# Patient Record
Sex: Male | Born: 1995 | Race: Black or African American | Hispanic: No | Marital: Single | Smoking: Never smoker
Health system: Southern US, Community
[De-identification: ages and names within clinical notes are randomized; demographics above are authoritative.]

## PROBLEM LIST (undated history)

## (undated) DIAGNOSIS — G43909 Migraine, unspecified, not intractable, without status migrainosus: Secondary | ICD-10-CM

---
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
## 2017-02-12 ENCOUNTER — Encounter: Payer: Self-pay | Admitting: Physical Therapy

## 2017-02-12 ENCOUNTER — Ambulatory Visit: Payer: Medicaid Other | Attending: Orthopedic Surgery | Admitting: Physical Therapy

## 2017-02-12 DIAGNOSIS — R600 Localized edema: Secondary | ICD-10-CM | POA: Diagnosis present

## 2017-02-12 DIAGNOSIS — M6281 Muscle weakness (generalized): Secondary | ICD-10-CM

## 2017-02-12 DIAGNOSIS — Z4789 Encounter for other orthopedic aftercare: Secondary | ICD-10-CM

## 2017-02-12 DIAGNOSIS — M25662 Stiffness of left knee, not elsewhere classified: Secondary | ICD-10-CM

## 2017-02-12 NOTE — Therapy (Addendum)
[facility] [address] [place], [place], [address] Phone: [phone]   Fax:  [phone]  Physical Therapy Evaluation  Patient Details  Name: Matthew Chambers MRN: [id number] Date of Birth: [birth year] Referring Provider: Dr. Carmell Austria   Encounter Date: 02/12/2017  PT End of Session - 02/12/17 1413    Visit Number  1    Number of Visits  12    Date for PT Re-Evaluation  03/26/17    PT Start Time  1340    PT Stop Time  1422    PT Time Calculation (min)  42 min    Activity Tolerance  Patient tolerated treatment well    Behavior During Therapy  [facility] for tasks assessed/performed       History reviewed. No pertinent past medical history.  History reviewed. No pertinent surgical history.  There were no vitals filed for this visit.   Subjective Assessment - 02/12/17 1342    Subjective  Pt was playing intramural basketball, injured knee 11/19/16 and eventually had surgery 12/30/16.  He is in school at [employer], family in [place].  He is about 6 week out, had about 4 weeks of PT starting 1 week post surgery.  Has been doing HEP.  Pain is only with sitting for long periods when he stands, mostly stiffness.  Min swelling.  Walks better than prior.  He stopped wearing the brace when cleared.  He has some challenge with walking downhiill.   Used crutches post surgery.      Pertinent History  Had L knee arthroscopic ACL reconstruction and meniscectomy on 12/30/16    Limitations  Sitting;Standing;Walking;Other (comment) unable to play basketball, go to the gym, cleared for stationary bike    How long can you sit comfortably?  60 min stiff when standing     How long can you stand comfortably?  normal     How long can you walk comfortably?  normal     Diagnostic tests  XR     Patient Stated Goals  Pt would like to be able to run, play basketball again     Currently in Pain?  No/denies    Pain Score  2  with activity     Pain  Location  Knee    Pain Orientation  Left    Pain Descriptors / Indicators  Aching    Pain Type  Surgical pain    Pain Onset  More than a month ago    Pain Frequency  Intermittent    Aggravating Factors   transition to stand, bend too much, step down     Pain Relieving Factors  rest, walking a bit, gentle stretching     Effect of Pain on Daily Activities  limits gym and activity, take the elevator          [facility] PT Assessment - 02/13/17 0001      Assessment   Medical Diagnosis  L ACL reconstruction     Referring Provider  Dr. 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 25 inch     Circumferential - Left   17. 5 inch superior patella      Sensation   Light Touch  Impaired by gross assessment    Additional Comments  distal to patella L knee       Functional Tests   Functional tests  Squat;Single leg stance      Single Leg Stance   Comments  20 sec mild difficulty on L , Rt LE WNL       Posture/Postural Control   Posture Comments  not remarkable       AROM    Right Knee Extension  3    Right Knee Flexion  128    Left Knee Extension  5 8 deg quad lag     Left Knee Flexion  115      Strength   Right Hip Flexion  5/5    Left Hip Flexion  4/5    Right Knee Flexion  5/5    Right Knee Extension  5/5    Left Knee Flexion  4/5    Left Knee Extension  4/5      Flexibility   Hamstrings  min tightness      Palpation   Patella mobility  hypomobile     Palpation comment  non tender, min numbness      Ambulation/Gait   Ambulation Distance (Feet)  150 Feet    Gait Comments  no noticeable gait deviaton              Objective measurements completed on examination: See above findings.      [facility] Adult PT Treatment/Exercise - 02/13/17 0001      Self-Care   Self-Care  RICE;Heat/Ice Application;Other Self-Care Comments    RICE  ice post session    Other Self-Care Comments   Protocol, gait, steps       Knee/Hip Exercises: Aerobic   Stationary Bike  5 min no resistance       Cryotherapy   Number Minutes Cryotherapy  8 Minutes    Cryotherapy Location  Knee    Type of Cryotherapy  Ice pack             PT Education - 02/12/17 1411    Education provided  Yes    Education Details  PT, POC, protocol, OK for recumbant bike , ICE post    Person(s) Educated  Patient    Methods  Explanation;Handout    Comprehension  Verbalized understanding;Returned demonstration          PT Long Term Goals - 02/12/17 1617      PT LONG TERM GOAL #1   Title  Pt will be I with more advanced  HEP including modified gym exercises, light elliptical (not allowed until 12 weeks)     Baseline  has initial HEP from previous PT episode     Time  6    Period  Weeks    Status  New    Target Date  03/26/17      PT LONG TERM GOAL #2   Title  Pt will be able to stand on his L LE for 30 sec without difficulty with mod balance challenges.     Baseline  unable to do static >20 sec    Time  6    Period  Weeks    Status  New    Target Date  03/26/17       PT LONG TERM GOAL #3   Title  Pt will be able to negotiate 2-3 flights of stairs at school, reciprocally with no pain, no handrail.      Baseline  min discomfort heavy use of rail reciprocally    Time  6    Period  Weeks    Status  New    Target Date  03/26/17      PT LONG TERM GOAL #4   Title  Pt will have less than 5 deg quad lag x 10 reps     Baseline  8-10 deg lag     Time  6    Period  Weeks    Status  New    Target Date  03/26/17             Plan - 02/12/17 1631    Clinical Impression Statement  Pt presents for low complexity eval of L knee s/p ACL reconstruction.  He his at 6 weeks post op and is doing well.  He develops stiffness after sitting in class > 1 hour.  Knee AROM is lacking full extension and flexion (115 deg).  He had no pain with recumbant bike today and plans to ease into this at the gym with caution.      Clinical Presentation  Stable    Clinical Decision Making  Low    Rehab Potential  Excellent    PT Frequency  2x / week    PT Duration  6 weeks    PT Treatment/Interventions  ADLs/Self Care Home Management;Iontophoresis 4mg /ml Dexamethasone;Electrical Stimulation;Cryotherapy;Functional mobility training;Patient/family education;Therapeutic activities;Therapeutic exercise;Ultrasound;Balance training;Neuromuscular re-education;Manual techniques;Passive range of motion;Vasopneumatic Device    PT Next Visit Plan  to bring in PT HEP from other clinic.  Advance to CCK strengthening quads, hamstring.  check hip abd, add MMT.  Recumbant bike see protocol     PT Home Exercise Plan  has from other clinic     Consulted and Agree with Plan of Care  Patient       Patient will benefit from skilled therapeutic intervention in order to improve the following deficits and impairments:  Decreased range of motion, Increased fascial restricitons, Hypomobility, Impaired flexibility, Impaired sensation, Pain, Decreased balance, Decreased mobility, Decreased strength,  Increased edema  Visit Diagnosis: Aftercare for anterior cruciate ligament (ACL) repair - Plan: PT plan of care cert/re-cert  Localized edema - Plan: PT plan of care cert/re-cert  Muscle weakness (generalized) - Plan: PT plan of care cert/re-cert  Stiffness of left knee, not elsewhere classified - Plan: PT plan of care cert/re-cert     Problem List There are no active problems to display for this patient.   PAA,JENNIFER 02/13/2017, 8:15 AM  [facility] [address]  [place], [place], [address] Phone: [phone]   Fax:  [phone]  Name: Matthew Chambers MRN: [id number] Date of Birth: 06/24/1995   Karie Mainland, PT 02/13/17 8:15 AM Phone: [phone] Fax: [phone]

## 2017-02-26 ENCOUNTER — Ambulatory Visit: Payer: Medicaid Other | Attending: Orthopedic Surgery | Admitting: Physical Therapy

## 2017-02-26 DIAGNOSIS — M25662 Stiffness of left knee, not elsewhere classified: Secondary | ICD-10-CM | POA: Insufficient documentation

## 2017-02-26 DIAGNOSIS — R600 Localized edema: Secondary | ICD-10-CM | POA: Insufficient documentation

## 2017-02-26 DIAGNOSIS — Z4789 Encounter for other orthopedic aftercare: Secondary | ICD-10-CM | POA: Insufficient documentation

## 2017-02-26 DIAGNOSIS — M6281 Muscle weakness (generalized): Secondary | ICD-10-CM | POA: Insufficient documentation

## 2017-03-03 ENCOUNTER — Ambulatory Visit: Payer: Medicaid Other | Admitting: Physical Therapy

## 2017-03-05 ENCOUNTER — Encounter: Payer: Medicaid Other | Admitting: Physical Therapy

## 2017-03-10 ENCOUNTER — Ambulatory Visit: Payer: Medicaid Other | Admitting: Physical Therapy

## 2017-03-12 ENCOUNTER — Ambulatory Visit: Payer: Medicaid Other | Admitting: Physical Therapy

## 2017-03-17 ENCOUNTER — Ambulatory Visit: Payer: Medicaid Other | Admitting: Physical Therapy

## 2017-03-17 ENCOUNTER — Encounter: Payer: Self-pay | Admitting: Physical Therapy

## 2017-03-17 DIAGNOSIS — Z4789 Encounter for other orthopedic aftercare: Secondary | ICD-10-CM | POA: Diagnosis not present

## 2017-03-17 DIAGNOSIS — M25662 Stiffness of left knee, not elsewhere classified: Secondary | ICD-10-CM | POA: Diagnosis present

## 2017-03-17 DIAGNOSIS — M6281 Muscle weakness (generalized): Secondary | ICD-10-CM | POA: Diagnosis present

## 2017-03-17 DIAGNOSIS — R600 Localized edema: Secondary | ICD-10-CM

## 2017-03-17 NOTE — Therapy (Signed)
[facility] [address] [place], [place], [address] Phone: [phone]   Fax:  [phone]  Physical Therapy Treatment/POC renewal   Patient Details  Name: Doree FudgeMilton Halk MRN: [id number] Date of Birth: 1995-12-31 Referring Provider: Dr. Carmell Austria. Alex Creighton   Encounter Date: 03/17/2017  PT End of Session - 03/17/17 1346    Visit Number  2    Number of Visits  13    Date for PT Re-Evaluation  05/05/17    Authorization Time Period  2/20-4/16    Authorization - Visit Number  1    Authorization - Number of Visits  12    PT Start Time  1330    PT Stop Time  1423    PT Time Calculation (min)  53 min    Activity Tolerance  Patient tolerated treatment well    Behavior During Therapy  [facility] for tasks assessed/performed       History reviewed. No pertinent past medical history.  History reviewed. No pertinent surgical history.  There were no vitals filed for this visit.  Subjective Assessment - 03/17/17 1333    Subjective  No pain right now.  Sometimes has intermittent sharp pains can be when i'm sitting or laying down.  Has been doing his exercises.      Pertinent History  Had L knee arthroscopic ACL reconstruction and meniscectomy on 12/30/16    Currently in Pain?  No/denies         [facility] - 03/17/17 0001      AROM   Left Knee Flexion  125      Strength   Left Knee Flexion  5/5    Left Knee Extension  4/5          [facility] Adult PT Treatment/Exercise - 03/17/17 0001      Knee/Hip Exercises: Stretches   Active Hamstring Stretch  Left;3 reps;30 seconds    Knee: Self-Stretch to increase Flexion  Left;3 reps      Knee/Hip Exercises: Aerobic   Stationary Bike  5 min L 3       Knee/Hip Exercises: Standing   Forward Step Up  Left;1 set;20 reps;Hand Hold: 2;Step Height: 8"    Wall Squat  1 set;10 reps      Knee/Hip Exercises: Supine   Quad Sets  Strengthening;Left;1 set;10 reps    Straight Leg Raises   Strengthening;Left;1 set;10 reps    Straight Leg Raise with External Rotation  Strengthening;Left;1 set;10 reps      Cryotherapy   Number Minutes Cryotherapy  10 Minutes    Cryotherapy Location  Knee    Type of Cryotherapy  Ice pack                  PT Long Term Goals - 03/17/17 1419      PT LONG TERM GOAL #1   Title  Pt will be I with more advanced HEP including modified gym exercises, light elliptical (not allowed until 12 weeks)     Baseline  has initial HEP from previous PT episode , given today     Status  On-going      PT LONG TERM GOAL #2   Title  Pt will be able to stand on his L LE for 30 sec without difficulty with mod balance challenges.     Status  On-going      PT LONG TERM GOAL #3   Title  Pt will be able to negotiate 2-3 flights of stairs  at school, reciprocally with no pain, no handrail.      Status  On-going      PT LONG TERM GOAL #4   Title  Pt will have less than 5 deg quad lag x 10 reps     Status  On-going            Plan - 03/17/17 1346    Clinical Impression Statement  Pt doing well, cont to have weakness in LLE and decreased AROM, ant hip tightness. Decreased knee control with steps. Renewed POC to matrch MCD auth period . He is 10 weeks post, sees MD soon.  He was given HEP today based on his current presentation.     PT Frequency  2x / week    PT Duration  6 weeks 7 weeks     PT Treatment/Interventions  ADLs/Self Care Home Management;Iontophoresis 4mg /ml Dexamethasone;Electrical Stimulation;Cryotherapy;Functional mobility training;Patient/family education;Therapeutic activities;Therapeutic exercise;Ultrasound;Balance training;Neuromuscular re-education;Manual techniques;Passive range of motion;Vasopneumatic Device    PT Next Visit Plan  check HEP, progress strength, flexibility, advance to CCK , see protocol     PT Home Exercise Plan  ROM: quads, hams, hip flex, strength SLR , LAQ    Consulted and Agree with Plan of Care  Patient        Patient will benefit from skilled therapeutic intervention in order to improve the following deficits and impairments:  Decreased range of motion, Increased fascial restricitons, Hypomobility, Impaired flexibility, Impaired sensation, Pain, Decreased balance, Decreased mobility, Decreased strength, Increased edema  Visit Diagnosis: Aftercare for anterior cruciate ligament (ACL) repair  Localized edema  Muscle weakness (generalized)  Stiffness of left knee, not elsewhere classified     Problem List There are no active problems to display for this patient.   PAA,JENNIFER 03/17/2017, 2:21 PM  [facility] [address] [place], [place], [address] Phone: [phone]   Fax:  [phone]  Name: Degan Hanser MRN: [id number] Date of Birth: August 23, 1995  Karie Mainland, PT 03/17/17 2:22 PM Phone: [phone] Fax: [phone]

## 2017-03-19 ENCOUNTER — Ambulatory Visit: Payer: Medicaid Other | Admitting: Physical Therapy

## 2017-03-19 ENCOUNTER — Encounter: Payer: Self-pay | Admitting: Physical Therapy

## 2017-03-19 DIAGNOSIS — Z4789 Encounter for other orthopedic aftercare: Secondary | ICD-10-CM | POA: Diagnosis not present

## 2017-03-19 DIAGNOSIS — R600 Localized edema: Secondary | ICD-10-CM

## 2017-03-19 DIAGNOSIS — M6281 Muscle weakness (generalized): Secondary | ICD-10-CM

## 2017-03-19 DIAGNOSIS — M25662 Stiffness of left knee, not elsewhere classified: Secondary | ICD-10-CM

## 2017-03-19 NOTE — Therapy (Signed)
[facility] [address] [place], [place], [address] Phone: [phone]   Fax:  [phone]  Physical Therapy Treatment  Patient Details  Name: Matthew Chambers MRN: [id number] Date of Birth: [birth year] Referring Provider: Dr. Carmell Austria. Alex Creighton   Encounter Date: 03/19/2017  PT End of Session - 03/19/17 1355    Visit Number  3    Number of Visits  13    Date for PT Re-Evaluation  05/05/17    Authorization Time Period  2/20-4/16    Authorization - Visit Number  2    Authorization - Number of Visits  12    PT Start Time  1330    Activity Tolerance  Patient tolerated treatment well    Behavior During Therapy  [facility] for tasks assessed/performed       History reviewed. No pertinent past medical history.  History reviewed. No pertinent surgical history.  There were no vitals filed for this visit.  Subjective Assessment - 03/19/17 1330    Subjective  Went to the gym last night but didnt really do much.  No pain right now.     Currently in Pain?  No/denies        [facility] Adult PT Treatment/Exercise - 03/19/17 0001      Knee/Hip Exercises: Stretches   Active Hamstring Stretch  Left;3 reps    Knee: Self-Stretch to increase Flexion  Left;3 reps      Knee/Hip Exercises: Aerobic   Elliptical  L 1, ramp 10 for 5 min       Knee/Hip Exercises: Machines for Strengthening   Cybex Leg Press  1 plate x 30 reps, slow and controlled      Knee/Hip Exercises: Standing   Heel Raises  Both;1 set;15 reps;Limitations;Other (comment) off step for stretching     Heel Raises Limitations  single leg on floor x 15 LLE     Functional Squat  1 set;10 reps partial squat       Knee/Hip Exercises: Seated   Long Arc Quad  Strengthening;Left;1 set;15 reps    Long Arc Quad Weight  4 lbs.      Knee/Hip Exercises: Supine   Bridges  Strengthening;Both;1 set;10 reps    Single Leg Bridge  Strengthening;Left;1 set;10 reps    Straight Leg Raises   Strengthening;Left;1 set;10 reps    Straight Leg Raise with External Rotation  Strengthening;Left;1 set;10 reps                  PT Long Term Goals - 03/17/17 1419      PT LONG TERM GOAL #1   Title  Pt will be I with more advanced HEP including modified gym exercises, light elliptical (not allowed until 12 weeks)     Baseline  has initial HEP from previous PT episode , given today     Status  On-going      PT LONG TERM GOAL #2   Title  Pt will be able to stand on his L LE for 30 sec without difficulty with mod balance challenges.     Status  On-going      PT LONG TERM GOAL #3   Title  Pt will be able to negotiate 2-3 flights of stairs at school, reciprocally with no pain, no handrail.      Status  On-going      PT LONG TERM GOAL #4   Title  Pt will have less than 5 deg quad lag x 10 reps  Status  On-going            Plan - 03/19/17 1404    Clinical Impression Statement  Patient without pain today.  Weakness evident with bridging and SLR.  Is now 11 weeks post.  Agility can begin at 12 weeks but he may not be ready for more than light jogging. Ok to do knee ext and leg press at the gym, bike.     PT Treatment/Interventions  ADLs/Self Care Home Management;Iontophoresis 4mg /ml Dexamethasone;Electrical Stimulation;Cryotherapy;Functional mobility training;Patient/family education;Therapeutic activities;Therapeutic exercise;Ultrasound;Balance training;Neuromuscular re-education;Manual techniques;Passive range of motion;Vasopneumatic Device    PT Next Visit Plan  try knee ext, leg press machine, eliptical. Hamstring strength in prone or quadruped , progress.  Try light run if ready (will be >12 weeks)     PT Home Exercise Plan  ROM: quads, hams, hip flex, strength SLR , LAQ    Consulted and Agree with Plan of Care  Patient       Patient will benefit from skilled therapeutic intervention in order to improve the following deficits and impairments:  Decreased range of  motion, Increased fascial restricitons, Hypomobility, Impaired flexibility, Impaired sensation, Pain, Decreased balance, Decreased mobility, Decreased strength, Increased edema  Visit Diagnosis: Aftercare for anterior cruciate ligament (ACL) repair  Localized edema  Muscle weakness (generalized)  Stiffness of left knee, not elsewhere classified     Problem List There are no active problems to display for this patient.   Matthew Chambers 03/19/2017, 2:27 PM  [facility] [address] [place], [place], [address] Phone: [phone]   Fax:  [phone]  Name: Matthew Chambers MRN: [id number] Date of Birth: 1995-05-09   Karie Mainland, PT 03/19/17 2:27 PM Phone: [phone] Fax: [phone]

## 2017-03-31 ENCOUNTER — Encounter: Payer: Self-pay | Admitting: Physical Therapy

## 2017-03-31 ENCOUNTER — Ambulatory Visit: Payer: Medicaid Other | Attending: Orthopedic Surgery | Admitting: Physical Therapy

## 2017-03-31 DIAGNOSIS — Z4789 Encounter for other orthopedic aftercare: Secondary | ICD-10-CM | POA: Diagnosis present

## 2017-03-31 DIAGNOSIS — M6281 Muscle weakness (generalized): Secondary | ICD-10-CM | POA: Diagnosis present

## 2017-03-31 DIAGNOSIS — R600 Localized edema: Secondary | ICD-10-CM | POA: Diagnosis present

## 2017-03-31 DIAGNOSIS — M25662 Stiffness of left knee, not elsewhere classified: Secondary | ICD-10-CM | POA: Diagnosis present

## 2017-04-01 NOTE — Therapy (Signed)
[facility] [address] [place], [place], [address] Phone: [phone]   Fax:  [phone]  Physical Therapy Treatment  Patient Details  Name: Matthew Chambers MRN: [id number] Date of Birth: 1995-01-25 Referring Provider: Dr. Carmell Austria. Alex Creighton   Encounter Date: 03/31/2017  PT End of Session - 04/01/17 0814    Visit Number  4    Number of Visits  13    Date for PT Re-Evaluation  05/05/17    Authorization Time Period  2/20-4/16    Authorization - Visit Number  2    Authorization - Number of Visits  12    PT Start Time  1335    PT Stop Time  1415    PT Time Calculation (min)  40 min    Activity Tolerance  Patient tolerated treatment well    Behavior During Therapy  [facility] for tasks assessed/performed       History reviewed. No pertinent past medical history.  History reviewed. No pertinent surgical history.  There were no vitals filed for this visit.  Subjective Assessment - 03/31/17 1345    Subjective  Patient reports he has been working on exercises. He has been wworking on going up and down stairs. Going down stairs he feels like it is not smooth.     Pertinent History  Had L knee arthroscopic ACL reconstruction and meniscectomy on 12/30/16    Limitations  Sitting;Standing;Walking;Other (comment)    How long can you sit comfortably?  60 min stiff when standing     How long can you stand comfortably?  normal     How long can you walk comfortably?  normal     Diagnostic tests  XR     Patient Stated Goals  Pt would like to be able to run, play basketball again                       [facility] Treatment/Exercise - 04/01/17 0001      Knee/Hip Exercises: Stretches   Active Hamstring Stretch  Left;3 reps      Knee/Hip Exercises: Aerobic   Tread Mill  2.5 MPH L8 incline       Knee/Hip Exercises: Machines for Strengthening   Cybex Leg Press  60lb 3x10       Knee/Hip Exercises: Standing   Heel Raises  Limitations  x30     Knee Flexion  2 sets;10 reps    Forward Step Up  2 sets;15 reps;Step Height: 8"    Functional Squat  2 sets;10 reps cuing for technique       Knee/Hip Exercises: Seated   Long Arc Quad  Strengthening;Left;1 set;15 reps    Long Arc Quad Weight  4 lbs.      Knee/Hip Exercises: Supine   Bridges with Clamshell  2 sets;10 reps green     Straight Leg Raises  3 sets;10 reps;Limitations    Straight Leg Raises Limitations  2lb       Cryotherapy   Number Minutes Cryotherapy  10 Minutes    Cryotherapy Location  Knee    Type of Cryotherapy  Ice pack             PT Education - 04/01/17 0814    Education provided  Yes    Education Details  reviewed timline for activity     Person(s) Educated  Patient    Methods  Explanation;Demonstration;Tactile cues;Verbal cues    Comprehension  Verbalized understanding;Returned demonstration;Verbal cues  required;Tactile cues required          PT Long Term Goals - 03/17/17 1419      PT LONG TERM GOAL #1   Title  Pt will be I with more advanced HEP including modified gym exercises, light elliptical (not allowed until 12 weeks)     Baseline  has initial HEP from previous PT episode , given today     Status  On-going      PT LONG TERM GOAL #2   Title  Pt will be able to stand on his L LE for 30 sec without difficulty with mod balance challenges.     Status  On-going      PT LONG TERM GOAL #3   Title  Pt will be able to negotiate 2-3 flights of stairs at school, reciprocally with no pain, no handrail.      Status  On-going      PT LONG TERM GOAL #4   Title  Pt will have less than 5 deg quad lag x 10 reps     Status  On-going            Plan - 04/01/17 0817    Clinical Impression Statement  Patient tolerated treatment well. he still demostrates some weakness with SLR. He does not appear to be ready for jogging or agilitys. Therapy will advance single leg activity as tolerated.     Clinical Presentation  Stable     Clinical Decision Making  Low    Rehab Potential  Excellent    PT Frequency  2x / week    PT Duration  6 weeks    PT Treatment/Interventions  ADLs/Self Care Home Management;Iontophoresis 4mg /ml Dexamethasone;Electrical Stimulation;Cryotherapy;Functional mobility training;Patient/family education;Therapeutic activities;Therapeutic exercise;Ultrasound;Balance training;Neuromuscular re-education;Manual techniques;Passive range of motion;Vasopneumatic Device    PT Next Visit Plan  try knee ext, leg press machine, eliptical. Hamstring strength in prone or quadruped , progress.  Try light run if ready (will be >12 weeks)     PT Home Exercise Plan  ROM: quads, hams, hip flex, strength SLR , LAQ    Consulted and Agree with Plan of Care  Patient       Patient will benefit from skilled therapeutic intervention in order to improve the following deficits and impairments:     Visit Diagnosis: Aftercare for anterior cruciate ligament (ACL) repair  Localized edema  Muscle weakness (generalized)  Stiffness of left knee, not elsewhere classified     Problem List There are no active problems to display for this patient.   Dessie Coma 04/01/2017, 8:40 AM  [facility] [address] [place], [place], [address] Phone: [phone]   Fax:  [phone]  Name: Matthew Chambers MRN: [id number] Date of Birth: [birth year]

## 2017-04-07 ENCOUNTER — Ambulatory Visit: Payer: Medicaid Other | Admitting: Physical Therapy

## 2017-04-07 ENCOUNTER — Encounter: Payer: Self-pay | Admitting: Physical Therapy

## 2017-04-07 DIAGNOSIS — Z4789 Encounter for other orthopedic aftercare: Secondary | ICD-10-CM | POA: Diagnosis not present

## 2017-04-07 DIAGNOSIS — M6281 Muscle weakness (generalized): Secondary | ICD-10-CM

## 2017-04-07 DIAGNOSIS — R600 Localized edema: Secondary | ICD-10-CM

## 2017-04-07 DIAGNOSIS — M25662 Stiffness of left knee, not elsewhere classified: Secondary | ICD-10-CM

## 2017-04-07 NOTE — Patient Instructions (Signed)
   Garen LahLawrie Beardsley, PT Certified Exercise Expert for the Aging Adult  04/07/17 1:54 PM Phone: [phone] Fax: [phone]

## 2017-04-07 NOTE — Therapy (Signed)
[facility] [address] [place], [place], [address] Phone: [phone]   Fax:  [phone]  Physical Therapy Treatment  Patient Details  Name: Matthew Chambers MRN: [id number] Date of Birth: [birth year] Referring Provider: Dr. Carmell Austria   Encounter Date: 04/07/2017  PT End of Session - 04/07/17 1754    Visit Number  5    Number of Visits  13    Date for PT Re-Evaluation  05/05/17    Authorization Time Period  2/20-4/16    Authorization - Visit Number  5    Authorization - Number of Visits  12    PT Start Time  1330    PT Stop Time  1414    PT Time Calculation (min)  44 min    Activity Tolerance  Patient tolerated treatment well    Behavior During Therapy  [facility] for tasks assessed/performed       History reviewed. No pertinent past medical history.  History reviewed. No pertinent surgical history.  There were no vitals filed for this visit.  Subjective Assessment - 04/07/17 1338    Subjective  I have no pain in my left knee but after I have been sitting down 15 minut to an hour, my first steps are stiff. I have been skipping down steps a little faster    Pertinent History  Had L knee arthroscopic ACL reconstruction and meniscectomy on 12/30/16    Limitations  Sitting;Standing;Walking;Other (comment)    Patient Stated Goals  Pt would like to be able to run, play basketball again     Currently in Pain?  No/denies    Pain Score  0-No pain    Pain Location  Knee    Pain Orientation  Left    Pain Type  Surgical pain                      [facility] Adult PT Treatment/Exercise - 04/07/17 1341      Knee/Hip Exercises: Stretches   Active Hamstring Stretch  Left;3 reps with stratch out strap      Knee/Hip Exercises: Aerobic   Tread Mill  3.0  MPH L8 incline 6 minutes      Knee/Hip Exercises: Machines for Strengthening   Cybex Leg Press  80 lb 3 x 10       Knee/Hip Exercises: Standing   Knee Flexion  2  sets;10 reps 5 lb weight in standing    Forward Step Up  2 sets;15 reps;Step Height: 8"    SLS  SLS sit to stand on left with 4 in step and ther ex pad on mat.    SLS with Vectors  4 minutes SLS clock on left leg    Other Standing Knee Exercises  3 way calf  raise straight 20 times, single leg stance clocks x 10 , medicine ball SLS ball toss orange medicine ball x 15      Knee/Hip Exercises: Seated   Long Arc Quad  Strengthening;Left;1 set;15 reps    Long Arc Quad Weight  5 lbs.      Knee/Hip Exercises: Supine   Bridges with Clamshell  2 sets;10 reps green     Straight Leg Raises  3 sets;10 reps;Limitations    Straight Leg Raises Limitations  4 lb      Cryotherapy   Number Minutes Cryotherapy  10 Minutes    Cryotherapy Location  Knee    Type of Cryotherapy  Ice pack  PT Education - 04/07/17 1354    Education provided  Yes    Education Details  added proprioceptive exercises    Person(s) Educated  Patient    Methods  Explanation;Demonstration;Tactile cues;Verbal cues;Handout    Comprehension  Verbalized understanding;Returned demonstration          PT Long Term Goals - 03/17/17 1419      PT LONG TERM GOAL #1   Title  Pt will be I with more advanced HEP including modified gym exercises, light elliptical (not allowed until 12 weeks)     Baseline  has initial HEP from previous PT episode , given today     Status  On-going      PT LONG TERM GOAL #2   Title  Pt will be able to stand on his L LE for 30 sec without difficulty with mod balance challenges.     Status  On-going      PT LONG TERM GOAL #3   Title  Pt will be able to negotiate 2-3 flights of stairs at school, reciprocally with no pain, no handrail.      Status  On-going      PT LONG TERM GOAL #4   Title  Pt will have less than 5 deg quad lag x 10 reps     Status  On-going            Plan - 04/07/17 1412    Clinical Impression Statement  Pt tolerated increased weights and beginining SLS  sit to stand with elevated seat. and proprioception exercises for preparation of future jogging and agility training. Pt reports he is able to skip down steps easier but always holds onto rail.Marland Kitchen.  Pt progressing to single limb activities.  No pain after RX    PT Frequency  2x / week    PT Duration  6 weeks    PT Treatment/Interventions  ADLs/Self Care Home Management;Iontophoresis 4mg /ml Dexamethasone;Electrical Stimulation;Cryotherapy;Functional mobility training;Patient/family education;Therapeutic activities;Therapeutic exercise;Ultrasound;Balance training;Neuromuscular re-education;Manual techniques;Passive range of motion;Vasopneumatic Device    PT Next Visit Plan  try knee ext, leg press machine, eliptical. Hamstring strength in prone or quadruped , progress.  Try light run if ready (will be >12 weeks)     PT Home Exercise Plan  ROM: quads, hams, hip flex, strength SLR , LAQ single leg stance medicine ball toss. single leg stance clocks    Consulted and Agree with Plan of Care  Patient       Patient will benefit from skilled therapeutic intervention in order to improve the following deficits and impairments:  Decreased range of motion, Increased fascial restricitons, Hypomobility, Impaired flexibility, Impaired sensation, Pain, Decreased balance, Decreased mobility, Decreased strength, Increased edema  Visit Diagnosis: Aftercare for anterior cruciate ligament (ACL) repair  Localized edema  Muscle weakness (generalized)  Stiffness of left knee, not elsewhere classified     Problem List There are no active problems to display for this patient.   Matthew Chambers, PT Certified Exercise Expert for the Aging Adult  04/07/17 5:57 PM Phone: [phone] Fax: [phone]  [facility] [address] [place], [place], [address] Phone: [phone]   Fax:  [phone]  Name: Matthew Chambers MRN: [id number] Date of Birth:  June 28, 1995

## 2017-04-09 ENCOUNTER — Ambulatory Visit: Payer: Medicaid Other | Admitting: Physical Therapy

## 2017-04-14 ENCOUNTER — Ambulatory Visit: Payer: Medicaid Other | Admitting: Physical Therapy

## 2017-04-14 DIAGNOSIS — M25662 Stiffness of left knee, not elsewhere classified: Secondary | ICD-10-CM

## 2017-04-14 DIAGNOSIS — R600 Localized edema: Secondary | ICD-10-CM

## 2017-04-14 DIAGNOSIS — Z4789 Encounter for other orthopedic aftercare: Secondary | ICD-10-CM

## 2017-04-14 DIAGNOSIS — M6281 Muscle weakness (generalized): Secondary | ICD-10-CM

## 2017-04-15 ENCOUNTER — Encounter: Payer: Self-pay | Admitting: Physical Therapy

## 2017-04-15 NOTE — Therapy (Signed)
[facility] [address] [place], [place], [address] Phone: [phone]   Fax:  [phone]  Physical Therapy Treatment  Patient Details  Name: Matthew Chambers MRN: [id number] Date of Birth: [birth year] Referring Provider: Dr. Carmell Austria   Encounter Date: 04/14/2017  PT End of Session - 04/14/17 1342    Visit Number  6    Number of Visits  13    Date for PT Re-Evaluation  05/05/17    Authorization Time Period  2/20-4/16    Authorization - Visit Number  5    Authorization - Number of Visits  12    PT Start Time  1338    PT Stop Time  1416    PT Time Calculation (min)  38 min    Activity Tolerance  Patient tolerated treatment well    Behavior During Therapy  [facility] for tasks assessed/performed       History reviewed. No pertinent past medical history.  History reviewed. No pertinent surgical history.  There were no vitals filed for this visit.  Subjective Assessment - 04/14/17 1340    Subjective  Pt reports that knee has remained the same since last visit. Pt reports that the feeling in his knee doesnt change much until he tries something new. Patient reports no pain in his knee today.     Pertinent History  Had L knee arthroscopic ACL reconstruction and meniscectomy on 12/30/16    Limitations  Sitting;Standing;Walking;Other (comment)    How long can you sit comfortably?  60 min stiff when standing     How long can you stand comfortably?  normal     How long can you walk comfortably?  normal     Diagnostic tests  XR     Patient Stated Goals  Pt would like to be able to run, play basketball again     Currently in Pain?  No/denies                No data recorded       [facility] Adult PT Treatment/Exercise - 04/15/17 0001      Knee/Hip Exercises: Aerobic   Elliptical  4 mins      Knee/Hip Exercises: Machines for Strengthening   Cybex Leg Press  eccentirc 40 lbs       Knee/Hip Exercises: Standing   Forward Step Up  2 sets;15 reps;Step Height: 8"    Step Down  Step Height: 4";10 reps    Functional Squat  2 sets;10 reps cuing for technique     Rebounder  2x20; SLS on left    Other Standing Knee Exercises  cone drill to counter 2x10;       Knee/Hip Exercises: Supine   Straight Leg Raises  3 sets;10 reps;Limitations    Straight Leg Raises Limitations  4 lb             PT Education - 04/15/17 0913    Education provided  Yes    Education Details  improtance of buliding single leg stability     Person(s) Educated  Patient    Methods  Explanation;Demonstration;Tactile cues;Verbal cues    Comprehension  Verbalized understanding;Returned demonstration;Verbal cues required;Tactile cues required          PT Long Term Goals - 03/17/17 1419      PT LONG TERM GOAL #1   Title  Pt will be I with more advanced HEP including modified gym exercises, light elliptical (not allowed until 12 weeks)  Baseline  has initial HEP from previous PT episode , given today     Status  On-going      PT LONG TERM GOAL #2   Title  Pt will be able to stand on his L LE for 30 sec without difficulty with mod balance challenges.     Status  On-going      PT LONG TERM GOAL #3   Title  Pt will be able to negotiate 2-3 flights of stairs at school, reciprocally with no pain, no handrail.      Status  On-going      PT LONG TERM GOAL #4   Title  Pt will have less than 5 deg quad lag x 10 reps     Status  On-going            Plan - 04/15/17 0917    Clinical Impression Statement  Pateint continues to show decreased single leg stability. Therapy advised the patient that until that improves he will lnot be able to run. Patient was giveven updated HEP and advised he should be doing his exercises daily and going to the gym 3-4x a week if able. He plans to go back to basketball. He is a long qays away from safely going back to basketball at this point.     Clinical Presentation  Stable    Clinical  Decision Making  Low    Rehab Potential  Excellent    PT Frequency  2x / week    PT Duration  6 weeks    PT Treatment/Interventions  ADLs/Self Care Home Management;Iontophoresis 4mg /ml Dexamethasone;Electrical Stimulation;Cryotherapy;Functional mobility training;Patient/family education;Therapeutic activities;Therapeutic exercise;Ultrasound;Balance training;Neuromuscular re-education;Manual techniques;Passive range of motion;Vasopneumatic Device    PT Next Visit Plan  try knee ext, leg press machine, eliptical. Hamstring strength in prone or quadruped , progress.  Try light run if ready (will be >12 weeks)     PT Home Exercise Plan  ROM: quads, hams, hip flex, strength SLR , LAQ single leg stance medicine ball toss. single leg stance clocks    Consulted and Agree with Plan of Care  Patient       Patient will benefit from skilled therapeutic intervention in order to improve the following deficits and impairments:  Decreased range of motion, Increased fascial restricitons, Hypomobility, Impaired flexibility, Impaired sensation, Pain, Decreased balance, Decreased mobility, Decreased strength, Increased edema  Visit Diagnosis: Aftercare for anterior cruciate ligament (ACL) repair  Localized edema  Muscle weakness (generalized)  Stiffness of left knee, not elsewhere classified     Problem List There are no active problems to display for this patient.   [name] PT DPT  04/15/2017, 9:26 AM   Elisabeth PigeonNicole Bailey SPT  04/15/2017   During this treatment session, the therapist was present, participating in and directing the treatment.  [facility] [address] [place], [place], [address] Phone: [phone]   Fax:  [phone]  Name: Matthew Chambers MRN: [id number] Date of Birth: March 25, 1995

## 2017-04-16 ENCOUNTER — Ambulatory Visit: Payer: Medicaid Other | Admitting: Physical Therapy

## 2017-04-21 ENCOUNTER — Ambulatory Visit: Payer: Medicaid Other | Attending: Orthopedic Surgery | Admitting: Physical Therapy

## 2017-04-21 ENCOUNTER — Encounter: Payer: Self-pay | Admitting: Physical Therapy

## 2017-04-21 DIAGNOSIS — R600 Localized edema: Secondary | ICD-10-CM | POA: Diagnosis present

## 2017-04-21 DIAGNOSIS — M6281 Muscle weakness (generalized): Secondary | ICD-10-CM | POA: Diagnosis present

## 2017-04-21 DIAGNOSIS — Z4789 Encounter for other orthopedic aftercare: Secondary | ICD-10-CM | POA: Insufficient documentation

## 2017-04-21 DIAGNOSIS — M25662 Stiffness of left knee, not elsewhere classified: Secondary | ICD-10-CM | POA: Insufficient documentation

## 2017-04-21 NOTE — Therapy (Addendum)
[place], [place], [address] Phone: [phone]   Fax:  [phone]  Physical Therapy Treatment and Discharge (Addended)   Patient Details  Name: Matthew Chambers MRN: [id number] Date of Birth: [birth year] Referring Provider: Dr. Tawanna Sat   Encounter Date: 04/21/2017  PT End of Session - 04/21/17 1409    Visit Number  7    Number of Visits  13    Date for PT Re-Evaluation  05/05/17    Authorization Time Period  2/20-4/16    Authorization - Visit Number  6    Authorization - Number of Visits  12    PT Start Time  1330    PT Stop Time  1415    PT Time Calculation (min)  45 min    Activity Tolerance  Patient tolerated treatment well    Behavior During Therapy  [facility] for tasks assessed/performed       History reviewed. No pertinent past medical history.  History reviewed. No pertinent surgical history.  There were no vitals filed for this visit.  Subjective Assessment - 04/21/17 1332    Subjective  I want to run. I havent even been able to jump yet. No pain.     Currently in Pain?  No/denies         [facility] PT Assessment - 04/21/17 0001      AROM   Left Knee Extension  6    Left Knee Flexion  126      Strength   Overall Strength Comments  L glute medius 4/5, hip abduction 4/5 Lt LE     Left Knee Extension  5/5          [facility] Adult PT Treatment/Exercise - 04/21/17 0001      Knee/Hip Exercises: Stretches   Active Hamstring Stretch  Left;3 reps;30 seconds    Piriformis Stretch  Left;3 reps      Knee/Hip Exercises: Aerobic   Stationary Bike  6 min L4 for 5 min       Knee/Hip Exercises: Standing   Hip Abduction  Stengthening;Both;1 set;15 reps    Lateral Step Up  Left;1 set;20 reps;Hand Hold: 1;Step Height: 6"    Step Down  Left;1 set;20 reps;Hand Hold: 1;Hand Hold: 0    Wall Squat  1 set;20 reps and isometric hold x 30 sec with ball     SLS  SLS clocks, used washcloth for smoother  movement     Walking with Sports Cord  lateral band walks green band on thighs 4 x 25 feet     Other Standing Knee Exercises  hamstring windmill x 10 each side , used 8 inch step     Other Standing Knee Exercises  glute med drops off step       Knee/Hip Exercises: Supine   Straight Leg Raises  Strengthening;Left;1 set;10 reps                  PT Long Term Goals - 04/21/17 1401      PT LONG TERM GOAL #1   Title  Pt will be I with more advanced HEP including modified gym exercises, light elliptical (not allowed until 12 weeks)     Status  On-going      PT LONG TERM GOAL #2   Title  Pt will be able to stand on his L LE for 30 sec without difficulty with mod balance challenges.     Baseline  static 30 sec  Status  On-going      PT LONG TERM GOAL #3   Title  Pt will be able to negotiate 2-3 flights of stairs at school, reciprocally with no pain, no handrail.      Baseline  uses for safety when he runs up the stairs     Status  Achieved      PT LONG TERM GOAL #4   Title  Pt will have less than 5 deg quad lag x 10 reps     Baseline  8 deg     Status  On-going            Plan - 04/21/17 1418    Clinical Impression Statement  Patient cont to have decreased Single leg stability, needs cues to maintain good form (pelvis) with lowering off step.  Has 8 deg quad lag. No longer needs UE assist to ascend and descend stairs at a normal pace.  Progressing slowly.  Strength improved with MMT but limited functionally.     PT Treatment/Interventions  ADLs/Self Care Home Management;Iontophoresis 26m/ml Dexamethasone;Electrical Stimulation;Cryotherapy;Functional mobility training;Patient/family education;Therapeutic activities;Therapeutic exercise;Ultrasound;Balance training;Neuromuscular re-education;Manual techniques;Passive range of motion;Vasopneumatic Device    PT Next Visit Plan  try knee ext, leg press machine, eliptical. Hamstring strength in prone or quadruped , progress.   Consider light jump on reformer 1 red . Try light run if ready (will be >12 weeks)     PT Home Exercise Plan  ROM: quads, hams, hip flex, strength SLR , LAQ single leg stance medicine ball toss. single leg stance clocks    Consulted and Agree with Plan of Care  Patient       Patient will benefit from skilled therapeutic intervention in order to improve the following deficits and impairments:  Decreased range of motion, Increased fascial restricitons, Hypomobility, Impaired flexibility, Impaired sensation, Pain, Decreased balance, Decreased mobility, Decreased strength, Increased edema  Visit Diagnosis: Aftercare for anterior cruciate ligament (ACL) repair  Localized edema  Muscle weakness (generalized)  Stiffness of left knee, not elsewhere classified     Problem List There are no active problems to display for this patient.   Matthew Chambers 04/21/2017, 2:24 PM  [facility] [address]Phone: [phone]  Fax:  [phone] Name: Matthew UmholtzMRN: [id number] of Birth: [birth year] JRaeford Razor PT 04/21/17 2:24 PM Phone: [phone]: [phone]  PHYSICAL THERAPY DISCHARGE SUMMARY  Visits from Start of Care: 7  Current functional level related to goals / functional outcomes: See above    Remaining deficits: Knee strength, muscular endurance and control    Education / Equipment: HEP, knee control , gym exercises, ROM , RICE  Plan: Patient agrees to discharge.  Patient goals were partially met. Patient is being discharged due to not returning since the last visit.  ?????    JRaeford Razor PT 07/02/17 2:39 PM Phone: [phone]: [phone]

## 2017-04-23 ENCOUNTER — Ambulatory Visit: Payer: Medicaid Other | Admitting: Physical Therapy

## 2017-04-28 ENCOUNTER — Ambulatory Visit: Payer: Medicaid Other | Admitting: Physical Therapy

## 2017-05-07 ENCOUNTER — Ambulatory Visit: Payer: Medicaid Other | Admitting: Physical Therapy

## 2018-06-10 ENCOUNTER — Emergency Department (HOSPITAL_COMMUNITY): Payer: No Typology Code available for payment source

## 2018-06-10 ENCOUNTER — Encounter (HOSPITAL_COMMUNITY): Payer: Self-pay | Admitting: Emergency Medicine

## 2018-06-10 ENCOUNTER — Other Ambulatory Visit: Payer: Self-pay

## 2018-06-10 ENCOUNTER — Emergency Department (HOSPITAL_COMMUNITY)
Admission: EM | Admit: 2018-06-10 | Discharge: 2018-06-10 | Disposition: A | Discharge status: Home / Self Care | Payer: No Typology Code available for payment source | Attending: Emergency Medicine | Admitting: Emergency Medicine

## 2018-06-10 DIAGNOSIS — Y92410 Unspecified street and highway as the place of occurrence of the external cause: Secondary | ICD-10-CM | POA: Diagnosis not present

## 2018-06-10 DIAGNOSIS — Y998 Other external cause status: Secondary | ICD-10-CM | POA: Insufficient documentation

## 2018-06-10 DIAGNOSIS — Y9389 Activity, other specified: Secondary | ICD-10-CM | POA: Insufficient documentation

## 2018-06-10 DIAGNOSIS — M25512 Pain in left shoulder: Secondary | ICD-10-CM

## 2018-06-10 DIAGNOSIS — M7918 Myalgia, other site: Secondary | ICD-10-CM

## 2018-06-10 DIAGNOSIS — S0990XA Unspecified injury of head, initial encounter: Secondary | ICD-10-CM | POA: Insufficient documentation

## 2018-06-10 DIAGNOSIS — W2210XA Striking against or struck by unspecified automobile airbag, initial encounter: Secondary | ICD-10-CM | POA: Diagnosis not present

## 2018-06-10 HISTORY — DX: Migraine, unspecified, not intractable, without status migrainosus: G43.909
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
## 2018-06-10 NOTE — ED Notes (Signed)
Patient transported to X-ray 
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
## 2018-06-10 NOTE — ED Notes (Signed)
Patient transported to CT 
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
